# Patient Record
Sex: Male | Born: 1960
Health system: Southern US, Community
[De-identification: ages and names within clinical notes are randomized; demographics above are authoritative.]

---
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
## 2005-01-04 ENCOUNTER — Encounter: Admission: RE | Admit: 2005-01-04 | Discharge: 2005-01-04 | Payer: Self-pay | Admitting: Family Medicine

## 2008-06-13 ENCOUNTER — Encounter: Admission: RE | Admit: 2008-06-13 | Discharge: 2008-06-13 | Payer: Self-pay | Admitting: Family Medicine

## 2012-07-18 ENCOUNTER — Ambulatory Visit (INDEPENDENT_AMBULATORY_CARE_PROVIDER_SITE_OTHER): Payer: 59 | Admitting: Family Medicine

## 2012-07-18 ENCOUNTER — Ambulatory Visit (AMBULATORY_SURGERY_CENTER): Payer: 59 | Admitting: *Deleted

## 2012-07-18 ENCOUNTER — Encounter: Payer: Self-pay | Admitting: Family Medicine

## 2012-07-18 VITALS — Ht 74.0 in | Wt 182.0 lb

## 2012-07-18 VITALS — BP 139/87 | HR 64 | Ht 74.0 in | Wt 180.0 lb

## 2012-07-18 DIAGNOSIS — M722 Plantar fascial fibromatosis: Secondary | ICD-10-CM

## 2012-07-18 DIAGNOSIS — Z1211 Encounter for screening for malignant neoplasm of colon: Secondary | ICD-10-CM

## 2012-07-18 MED ORDER — NITROGLYCERIN 0.2 MG/HR TD PT24: MEDICATED_PATCH | TRANSDERMAL | Status: DC

## 2012-07-18 MED ORDER — MOVIPREP 100 G PO SOLR: ORAL | Status: DC

## 2012-07-18 NOTE — Assessment & Plan Note (Addendum)
Patient's clinical exam and ultrasound findings is very consistent with plantar fasciitis. Patient is given a nitroglycerin patch to wear. Protocol to go over with him as well as with over potential side effects. Patient will return on Monday with his running shoes as well as insoles that he has. We will monitor his running gait and see if there is any other mechanical problems that might be contributing to the plantar fasciitis. In addition to this patient may be a good candidate for custom orthotics. ADDENDUM: I agree with A&P per Dr Michaelle Copas note. I think he would be a good candidate for CSI given the fairly large amount of edema seen on [place] and teh chronicity of the issue. I would recommend we continue th NTG patch as well if he is tolerating it--6 weeks minimum. Gait analysis and possible orthotics.

## 2012-07-18 NOTE — Patient Instructions (Signed)
Try the nitro patch.  Try 1/4 patch daily at first.  Come back Monday with your running shoes, we may try to make you custom orthotics.

## 2012-07-18 NOTE — Progress Notes (Signed)
51 year old male coming in today as a new patient to [place]. Patient is coming in with right foot pain. Patient states that he has a past medical history significant for plantar fasciitis. Patient states she's had this for multiple years. Patient is a runner and is running about 20-25 miles per week. Patient states though this is gradually worsen especially over the course of the last couple months. Patient states that he used to get insoles for his shoes and he seemed to help significantly. Patient though states that even though he get a New insoles and shoes as well as insoles now did not make any improvement. Patient has tried anti-inflammatories and has been doing stretches but has not made any improvement. Patient is here for further evaluation. Patient denies any numbness any swelling. Patient states that the pain is worse when he does get up from a seated position or first up in the morning.  Review of system: 14 point system review was reviewed and negative. No past medical history on file. patient states unremarkable. No past surgical history on file. No family history on file. Allergies  Allergen Reactions  . Erythromycin Nausea And Vomiting   Physical exam Filed Vitals:   07/18/12 1021  BP: 139/87  Pulse: 64   General: No apparent distress healthy male Respiratory: Patient able to speak in full sentences and does not appear short of breath. Mood and affect normal Foot exam Normal inspection with no visable or palpable fat pad atrophy and no visible swelling/erythema. Patient is tender at medial insertion of plantar fascia into calcaneus. Great toe motion: Normal Arch shape: Patient has good longitudinal and transverse arches. Other foot breakdown: Patient was standing does have various deformity of the right hindfoot.  Ultrasound was done and interpreted by me today. Patient's plantar fasciitis was visualized showed that on insertion of the calcaneus patient does have hypoechoic  changes in the area. This extends to approximately 4 cm distally. No true tear appreciated. When measures though patient's plantar fasciitis at the insertion measures 0.83 cm which is significantly enlarged for this patient for his age group.

## 2012-07-20 ENCOUNTER — Encounter: Payer: Self-pay | Admitting: Family Medicine

## 2012-07-21 ENCOUNTER — Ambulatory Visit (INDEPENDENT_AMBULATORY_CARE_PROVIDER_SITE_OTHER): Payer: 59 | Admitting: Family Medicine

## 2012-07-21 VITALS — BP 148/78 | Ht 74.0 in | Wt 154.0 lb

## 2012-07-21 DIAGNOSIS — M722 Plantar fascial fibromatosis: Secondary | ICD-10-CM

## 2012-07-21 DIAGNOSIS — M79609 Pain in unspecified limb: Secondary | ICD-10-CM

## 2012-07-21 DIAGNOSIS — M79671 Pain in right foot: Secondary | ICD-10-CM

## 2012-07-22 ENCOUNTER — Encounter: Payer: Self-pay | Admitting: Family Medicine

## 2012-07-22 NOTE — Progress Notes (Signed)
  Subjective:    Patient ID: Timothy [name], male    DOB: 13-Apr-1961, 51 y.o.   MRN: [id number]  HPI  Right plan fasciitis. Started the nitroglycerin patch 2 days ago. Has had very slight headache but otherwise no side effects. He is here today for gait analysis and he brings his running shoes. Continues to have quite a bit of pain the plantar fascia of the right foot as described in the last office visit note.  Review of Systems No unusual weight change. Mild headache as described in history of present illness.    Objective:   Physical Exam  GENERAL: Well-developed male no acute distress vital signs are reviewed GAIT: Running. He'll [employer]. He strikes mostly on the medial aspect of the heel and in rolls a little further in word as he completes the plan phase. This is actually worse on the left foot. He has upright posture and normal swing phase. FEET: Mildly cavus. Tender to palpation the right plantar fascia origin. There is no skin lesions, no bruising. Neurovascularly intact.  INJECTION: Patient was given informed consent, signed copy in the chart. Appropriate time out was taken. Area prepped and draped in usual sterile fashion. One cc of methylprednisolone 40 mg/ml plus  one cc of 1% lidocaine without epinephrine was injected into the area of of the plantar fascia of the right foot using a(n) medial approach. The patient tolerated the procedure well. There were no complications. Post procedure instructions were given.    Patient was fitted for a : standard, cushioned, semi-rigid orthotic. The orthotic was heated, placed on the orthotic stand. The patient was positioned in subtalar neutral position and 10 degrees of ankle dorsiflexion in a weight bearing stance on the heated orthotic blank After completion of molding, a stable base was applied to the orthotic blank. The blank was ground to a stable position for weight bearing. Blank: Size 11 red Base: Blue foam Posting: Small amount  of light blue posting material at the medial heel distally.  Face to face time spent in evaluation, measurement and manufacture of custom molded orthotic was 40 minutes.     Assessment & Plan:

## 2012-07-24 ENCOUNTER — Encounter: Payer: Self-pay | Admitting: Internal Medicine

## 2012-07-24 ENCOUNTER — Ambulatory Visit (AMBULATORY_SURGERY_CENTER): Payer: 59 | Admitting: Internal Medicine

## 2012-07-24 VITALS — BP 143/87 | HR 70 | Temp 96.5°F | Resp 16 | Ht 74.0 in | Wt 182.0 lb

## 2012-07-24 DIAGNOSIS — D126 Benign neoplasm of colon, unspecified: Secondary | ICD-10-CM

## 2012-07-24 DIAGNOSIS — Z1211 Encounter for screening for malignant neoplasm of colon: Secondary | ICD-10-CM

## 2012-07-24 MED ORDER — SODIUM CHLORIDE 0.9 % IV SOLN: 500.0000 mL | INTRAVENOUS | Status: DC

## 2012-07-24 NOTE — Op Note (Signed)
[facility] [address] [place], [address]   COLONOSCOPY PROCEDURE REPORT  PATIENT: Timothy, [name]  MR#: [id number] BIRTHDATE: 1961/10/18 , 51  yrs. old GENDER: Male ENDOSCOPIST: Iva Boop, MD, [facility] REFERRED [id number] Shawnie Dapper, M.D. PROCEDURE DATE:  07/24/2012 PROCEDURE:   Colonoscopy with biopsy ASA CLASS:   Class I INDICATIONS:average risk screening. MEDICATIONS: propofol (Diprivan) 350mg  IV, MAC sedation, administered by CRNA, and These medications were titrated to patient response per physician's verbal order  DESCRIPTION OF PROCEDURE:   After the risks benefits and alternatives of the procedure were thoroughly explained, informed consent was obtained.  A digital rectal exam revealed the prostate was not enlarged.   The LB CF-[id number]  endoscope was introduced through the anus and advanced to the cecum, which was identified by both the appendix and ileocecal valve. No adverse events experienced.   The quality of the prep was excellent, using MoviPrep  The instrument was then slowly withdrawn as the colon was fully examined.      COLON FINDINGS: A sessile polyp was found in the distal sigmoid colon.  A polypectomy was performed with cold forceps.  The resection was complete and the polyp tissue was completely retrieved.   The colon mucosa was otherwise normal. Right colon retroflexion performed.  Retroflexed views revealed no abnormalities. The time to cecum=3 minutes 40 seconds  Withdrawal time=11:13 minutes.  The scope was withdrawn and the procedure completed. COMPLICATIONS: There were no complications. ENDOSCOPIC IMPRESSION: Sessile polyp2-3 mm size was found in the distal sigmoid colon; polypectomy was performed with cold forceps   RECOMMENDATIONS: Await pathology results  eSigned:  Iva Boop, MD, [facility] 07/24/2012 8:36 AM   cc: The Patient and Roderick Pee, MD

## 2012-07-24 NOTE — Progress Notes (Addendum)
Patient did not have preoperative order for IV antibiotic SSI prophylaxis. (G8918)  Patient did not experience any of the following events: a burn prior to discharge; a fall within the facility; wrong site/side/patient/procedure/implant event; or a hospital transfer or hospital admission upon discharge from the facility. (G8907)  

## 2012-07-24 NOTE — Patient Instructions (Addendum)
One very small polyp was removed - 2-3 mm. Looks benign.  I will let you know the results of the pathology report and recommendations for next routine colonoscopy.  Thank you for choosing me and [facility].  Iva Boop, MD, FACG YOU HAD AN ENDOSCOPIC PROCEDURE TODAY AT THE [facility]: Refer to the procedure report that was given to you for any specific questions about what was found during the examination.  If the procedure report does not answer your questions, please call your gastroenterologist to clarify.  If you requested that your care partner not be given the details of your procedure findings, then the procedure report has been included in a sealed envelope for you to review at your convenience later.  YOU SHOULD EXPECT: Some feelings of bloating in the abdomen. Passage of more gas than usual.  Walking can help get rid of the air that was put into your GI tract during the procedure and reduce the bloating. If you had a lower endoscopy (such as a colonoscopy or flexible sigmoidoscopy) you may notice spotting of blood in your stool or on the toilet paper. If you underwent a bowel prep for your procedure, then you may not have a normal bowel movement for a few days.  DIET: Your first meal following the procedure should be a light meal and then it is ok to progress to your normal diet.  A half-sandwich or bowl of soup is an example of a good first meal.  Heavy or fried foods are harder to digest and may make you feel nauseous or bloated.  Likewise meals heavy in dairy and vegetables can cause extra gas to form and this can also increase the bloating.  Drink plenty of fluids but you should avoid alcoholic beverages for 24 hours.  ACTIVITY: Your care partner should take you home directly after the procedure.  You should plan to take it easy, moving slowly for the rest of the day.  You can resume normal activity the day after the procedure however you should NOT DRIVE  or use heavy machinery for 24 hours (because of the sedation medicines used during the test).    SYMPTOMS TO REPORT IMMEDIATELY: A gastroenterologist can be reached at any hour.  During normal business hours, 8:30 AM to 5:00 PM Monday through Friday, call [phone].  After hours and on weekends, please call the GI answering service at [phone] who will take a message and have the physician on call contact you.   Following lower endoscopy (colonoscopy or flexible sigmoidoscopy):  Excessive amounts of blood in the stool  Significant tenderness or worsening of abdominal pains  Swelling of the abdomen that is new, acute  Fever of 100F or higher  FOLLOW UP: If any biopsies were taken you will be contacted by phone or by letter within the next 1-3 weeks.  Call your gastroenterologist if you have not heard about the biopsies in 3 weeks.  Our staff will call the home number listed on your records the next business day following your procedure to check on you and address any questions or concerns that you may have at that time regarding the information given to you following your procedure. This is a courtesy call and so if there is no answer at the home number and we have not heard from you through the emergency physician on call, we will assume that you have returned to your regular daily activities without incident.  SIGNATURES/CONFIDENTIALITY: You and/or your care  partner have signed paperwork which will be entered into your electronic medical record.  These signatures attest to the fact that that the information above on your After Visit Summary has been reviewed and is understood.  Full responsibility of the confidentiality of this discharge information lies with you and/or your care-partner.  

## 2012-07-25 ENCOUNTER — Telehealth: Payer: Self-pay | Admitting: *Deleted

## 2012-07-25 NOTE — Telephone Encounter (Signed)
  Follow up Call-  Call back number 07/24/2012  Post procedure Call Back phone  # [phone]  Permission to leave phone message Yes     Patient questions:  Do you have a fever, pain , or abdominal swelling? no Pain Score  0 *  Have you tolerated food without any problems? yes  Have you been able to return to your normal activities? yes  Do you have any questions about your discharge instructions: Diet   no Medications  no Follow up visit  no  Do you have questions or concerns about your Care? no  Actions: * If pain score is 4 or above: No action needed, pain <4.

## 2012-07-29 NOTE — Progress Notes (Signed)
Quick Note:  Hyperplastic polyp Repeat colon 10 years 2023 ______

## 2012-07-30 ENCOUNTER — Encounter: Payer: Self-pay | Admitting: Internal Medicine

## 2013-01-17 ENCOUNTER — Other Ambulatory Visit: Payer: Self-pay

## 2013-02-26 ENCOUNTER — Other Ambulatory Visit: Payer: Self-pay | Admitting: Internal Medicine

## 2013-02-26 LAB — COMPREHENSIVE METABOLIC PANEL
AST: 23 U/L (ref 0–37)
Albumin: 4.5 g/dL (ref 3.5–5.2)
Alkaline Phosphatase: 60 U/L (ref 39–117)
BUN: 12 mg/dL (ref 6–23)
Calcium: 9.4 mg/dL (ref 8.4–10.5)
Chloride: 100 mEq/L (ref 96–112)
Glucose, Bld: 82 mg/dL (ref 70–99)
Potassium: 4.4 mEq/L (ref 3.5–5.1)
Sodium: 136 mEq/L (ref 135–145)
Total Protein: 6.8 g/dL (ref 6.0–8.3)

## 2013-02-26 LAB — CBC WITH DIFFERENTIAL/PLATELET
Basophils Absolute: 0 10*3/uL (ref 0.0–0.1)
Eosinophils Absolute: 0.1 10*3/uL (ref 0.0–0.7)
Lymphocytes Relative: 39.4 % (ref 12.0–46.0)
MCHC: 33.9 g/dL (ref 30.0–36.0)
MCV: 88.5 fl (ref 78.0–100.0)
Monocytes Absolute: 0.5 10*3/uL (ref 0.1–1.0)
Neutrophils Relative %: 50.5 % (ref 43.0–77.0)
Platelets: 227 10*3/uL (ref 150.0–400.0)
RBC: 5.26 Mil/uL (ref 4.22–5.81)

## 2013-02-26 LAB — LIPID PANEL
Cholesterol: 147 mg/dL (ref 0–200)
LDL Cholesterol: 82 mg/dL (ref 0–99)
VLDL: 11.8 mg/dL (ref 0.0–40.0)

## 2013-02-26 LAB — PSA: PSA: 0.68 ng/mL (ref 0.10–4.00)

## 2013-03-06 ENCOUNTER — Ambulatory Visit (INDEPENDENT_AMBULATORY_CARE_PROVIDER_SITE_OTHER): Payer: 59 | Admitting: Internal Medicine

## 2013-03-06 DIAGNOSIS — D229 Melanocytic nevi, unspecified: Secondary | ICD-10-CM

## 2013-03-06 DIAGNOSIS — L819 Disorder of pigmentation, unspecified: Secondary | ICD-10-CM

## 2013-03-06 DIAGNOSIS — D239 Other benign neoplasm of skin, unspecified: Secondary | ICD-10-CM

## 2013-03-06 NOTE — Patient Instructions (Signed)
Leave the dressing in place until you've showered. Place a dab of Neosporin and a new bandage until a scab appears over the biopsy site. After the scab appears there is no need to keep a dressing in place unless the area can be irritated by clothing. Call our office if redness around the biopsy site appears and begins to spread more than a half inch from the site.  

## 2013-03-06 NOTE — Progress Notes (Signed)
  Subjective:    Patient ID: Timothy [name], male    DOB: 11/24/1961, 52 y.o.   MRN: [id number]  HPI  Change in mole on back with long sun exposure history   Review of Systems     Objective:   Physical Exam        Assessment & Plan:  Risk of skin ca moderate to high with non healing lesion on back Possible [place] CA Excisional Bx today Informed consent was given by the patient for a biopsy. The site was prepped with Betadine and using a 15 blade a 3cm elliptical biopsy was obtained. The specimin was placed in preservative and sent for pathology. Hemostasis was achieved with 7 5-0 sutures. Wound care was discussed with the patient. The patient was informed that it would be one to 2 weeks before the pathology will be interpreted.

## 2013-10-08 ENCOUNTER — Other Ambulatory Visit: Payer: Self-pay

## 2014-10-20 ENCOUNTER — Telehealth: Payer: Self-pay | Admitting: *Deleted

## 2014-10-20 DIAGNOSIS — K1379 Other lesions of oral mucosa: Secondary | ICD-10-CM

## 2014-10-20 NOTE — Telephone Encounter (Signed)
Pt needs a referral to Dr Lucia Gaskins for a small lesion on his soft palate x1 mth.  appt was scheduled for 11/24 at 1:30. Dr Burnice Logan is the referring provider.  Pt aware of appt.

## 2015-01-14 ENCOUNTER — Other Ambulatory Visit: Payer: Self-pay | Admitting: *Deleted

## 2015-01-24 MED ORDER — NEOMYCIN-POLYMYXIN-DEXAMETH 3.5-10000-0.1 OP SUSP: 1.0000 [drp] | Freq: Two times a day (BID) | OPHTHALMIC | Status: DC

## 2015-01-24 NOTE — Addendum Note (Signed)
Addended by: Townsend Roger D on: 01/24/2015 01:25 PM   Modules accepted: Orders

## 2016-02-03 DIAGNOSIS — Z0100 Encounter for examination of eyes and vision without abnormal findings: Secondary | ICD-10-CM | POA: Diagnosis not present

## 2017-02-08 DIAGNOSIS — H5213 Myopia, bilateral: Secondary | ICD-10-CM | POA: Diagnosis not present

## 2017-03-01 DIAGNOSIS — Z85828 Personal history of other malignant neoplasm of skin: Secondary | ICD-10-CM | POA: Diagnosis not present

## 2017-03-01 DIAGNOSIS — L739 Follicular disorder, unspecified: Secondary | ICD-10-CM | POA: Diagnosis not present

## 2017-03-01 DIAGNOSIS — L989 Disorder of the skin and subcutaneous tissue, unspecified: Secondary | ICD-10-CM | POA: Diagnosis not present

## 2017-03-01 DIAGNOSIS — L738 Other specified follicular disorders: Secondary | ICD-10-CM | POA: Diagnosis not present

## 2017-03-07 DIAGNOSIS — L738 Other specified follicular disorders: Secondary | ICD-10-CM | POA: Insufficient documentation

## 2017-06-06 ENCOUNTER — Telehealth: Payer: Self-pay | Admitting: *Deleted

## 2017-06-06 ENCOUNTER — Encounter: Payer: Self-pay | Admitting: *Deleted

## 2017-06-06 NOTE — Telephone Encounter (Signed)
-  Patient EKG in 2008 was "normal variant" and "IRBBB" -Timothy [name] did colonoscopy    Related to appointment tomorrow (06/07/17): -Expect routine blood work  -Physical tomorrow

## 2017-06-07 ENCOUNTER — Ambulatory Visit (INDEPENDENT_AMBULATORY_CARE_PROVIDER_SITE_OTHER): Payer: 59 | Admitting: Family Medicine

## 2017-06-07 ENCOUNTER — Encounter: Payer: Self-pay | Admitting: Family Medicine

## 2017-06-07 VITALS — BP 116/78 | HR 63 | Temp 97.9°F | Ht 74.0 in | Wt 180.6 lb

## 2017-06-07 DIAGNOSIS — Z0000 Encounter for general adult medical examination without abnormal findings: Secondary | ICD-10-CM

## 2017-06-07 LAB — COMPREHENSIVE METABOLIC PANEL
ALT: 13 U/L (ref 0–53)
AST: 16 U/L (ref 0–37)
Albumin: 4.3 g/dL (ref 3.5–5.2)
Alkaline Phosphatase: 49 U/L (ref 39–117)
BUN: 13 mg/dL (ref 6–23)
CO2: 28 mEq/L (ref 19–32)
Calcium: 9.4 mg/dL (ref 8.4–10.5)
Chloride: 106 mEq/L (ref 96–112)
Creatinine, Ser: 1.11 mg/dL (ref 0.40–1.50)
GFR: 72.83 mL/min (ref >60.00)
Glucose, Bld: 100 mg/dL — ABNORMAL HIGH (ref 70–99)
Potassium: 4.2 mEq/L (ref 3.5–5.1)
Sodium: 140 mEq/L (ref 135–145)
Total Bilirubin: 1 mg/dL (ref 0.2–1.2)
Total Protein: 6.6 g/dL (ref 6.0–8.3)

## 2017-06-07 LAB — CBC WITH DIFFERENTIAL/PLATELET
Basophils Absolute: 0 10*3/uL (ref 0.0–0.1)
Basophils Relative: 0.7 % (ref 0.0–3.0)
Eosinophils Absolute: 0 10*3/uL (ref 0.0–0.7)
Eosinophils Relative: 0.9 % (ref 0.0–5.0)
HCT: 43.4 % (ref 39.0–52.0)
Hemoglobin: 14.6 g/dL (ref 13.0–17.0)
Lymphocytes Relative: 39.8 % (ref 12.0–46.0)
Lymphs Abs: 1.7 10*3/uL (ref 0.7–4.0)
MCHC: 33.7 g/dL (ref 30.0–36.0)
MCV: 90.8 fl (ref 78.0–100.0)
Monocytes Absolute: 0.3 10*3/uL (ref 0.1–1.0)
Monocytes Relative: 7 % (ref 3.0–12.0)
Neutro Abs: 2.2 10*3/uL (ref 1.4–7.7)
Neutrophils Relative %: 51.6 % (ref 43.0–77.0)
Platelets: 208 10*3/uL (ref 150.0–400.0)
RBC: 4.78 Mil/uL (ref 4.22–5.81)
RDW: 13.5 % (ref 11.5–15.5)
WBC: 4.2 10*3/uL (ref 4.0–10.5)

## 2017-06-07 LAB — LIPID PANEL
Cholesterol: 135 mg/dL (ref 0–200)
HDL: 65.6 mg/dL (ref >39.00)
LDL Cholesterol: 57 mg/dL (ref 0–99)
NonHDL: 69.06
Total CHOL/HDL Ratio: 2
Triglycerides: 58 mg/dL (ref 0.0–149.0)
VLDL: 11.6 mg/dL (ref 0.0–40.0)

## 2017-06-07 NOTE — Progress Notes (Signed)
Subjective:  Shaune Pascal CMA acting as scribe for Dr. Juleen China.   Timothy [name] is a 56 y.o. male and is here for a comprehensive physical exam.  HPI: No current concerns. Exercises 6 days per week. Eats healthfully. Yearly labs WNL. Colonoscopy up to date.   Health Maintenance Due  Topic Date Due  . Hepatitis C Screening  Mar 13, 1961  . HIV Screening  06/05/1976  . TETANUS/TDAP  06/05/1980   PMHx, SurgHx, SocialHx, Medications, and Allergies were reviewed in the Visit Navigator and updated as appropriate.   No past medical history on file.  Past Surgical History:  Procedure Laterality Date  . TONSILLECTOMY  1966    Family History  Problem Relation Age of Onset  . Cancer Mother        Non-Hodgkin Lymphoma  . Heart disease Father        A.Fib   . Cancer Brother        Glioblastoma (gbm)  . Cancer Maternal Grandmother        oral squamous cell  . Heart disease Maternal Grandfather        myocardial infarction  . Cancer Paternal Grandmother        Breast Cancer  . Colon cancer Neg Hx   . Colon polyps Neg Hx   . Prostate cancer Neg Hx   . Rectal cancer Neg Hx    Social History  Substance Use Topics  . Smoking status: Never Smoker  . Smokeless tobacco: Never Used  . Alcohol use 1.8 oz/week    3 Glasses of wine per week   Review of Systems:   Review of Systems  Constitutional: Negative for chills, fever, malaise/fatigue and weight loss.  Respiratory: Negative for cough, shortness of breath and wheezing.   Cardiovascular: Negative for chest pain, palpitations and leg swelling.  Gastrointestinal: Negative for abdominal pain, constipation, diarrhea, nausea and vomiting.  Genitourinary: Negative for dysuria, frequency, hematuria and urgency.  Musculoskeletal: Negative for joint pain and myalgias.  Skin: Negative for rash.  Neurological: Negative for dizziness and headaches.  Psychiatric/Behavioral: Negative for depression, substance abuse and suicidal ideas. The  patient is not nervous/anxious.    Objective:    Vitals:   06/07/17 0748  BP: 116/78  Pulse: 63  Temp: 97.9 F (36.6 C)   Body mass index is 23.19 kg/m.  General  Alert, cooperative, no distress, appears stated age  Head:  Normocephalic, without obvious abnormality, atraumatic  Eyes:  PERRL, conjunctiva/corneas clear, EOM's intact, fundi benign, both eyes       Ears:  Normal TM's and external ear canals, both ears  Nose: Nares normal, septum midline, mucosa normal, no drainage or sinus tenderness  Throat: Lips, mucosa, and tongue normal; teeth and gums normal  Neck: Supple, symmetrical, trachea midline, no adenopathy;     thyroid:  No enlargement/tenderness/nodules; no carotid bruit or JVD  Back:   Symmetric, no curvature, ROM normal, no CVA tenderness  Lungs:   Clear to auscultation bilaterally, respirations unlabored  Chest wall:  No tenderness or deformity  Heart:  Regular rate and rhythm, S1 and S2 normal, no murmur, rub or gallop  Abdomen:   Soft, non-tender, bowel sounds active all four quadrants, no masses, no organomegaly  Extremities: Extremities normal, atraumatic, no cyanosis or edema  Prostate : Not done.  Skin: Skin color, texture, turgor normal, no rashes or lesions  Lymph nodes: Cervical, supraclavicular, and axillary nodes normal  Neurologic: CNII-XII grossly intact. Normal strength, sensation and reflexes throughout  Results for orders placed or performed in visit on 06/07/17  CBC with Differential/Platelet  Result Value Ref Range   WBC 4.2 4.0 - 10.5 K/uL   RBC 4.78 4.22 - 5.81 Mil/uL   Hemoglobin 14.6 13.0 - 17.0 g/dL   HCT 43.4 39.0 - 52.0 %   MCV 90.8 78.0 - 100.0 fl   MCHC 33.7 30.0 - 36.0 g/dL   RDW 13.5 11.5 - 15.5 %   Platelets 208.0 150.0 - 400.0 K/uL   Neutrophils Relative % 51.6 43.0 - 77.0 %   Lymphocytes Relative 39.8 12.0 - 46.0 %   Monocytes Relative 7.0 3.0 - 12.0 %   Eosinophils Relative 0.9 0.0 - 5.0 %   Basophils Relative 0.7 0.0  - 3.0 %   Neutro Abs 2.2 1.4 - 7.7 K/uL   Lymphs Abs 1.7 0.7 - 4.0 K/uL   Monocytes Absolute 0.3 0.1 - 1.0 K/uL   Eosinophils Absolute 0.0 0.0 - 0.7 K/uL   Basophils Absolute 0.0 0.0 - 0.1 K/uL  Comprehensive metabolic panel  Result Value Ref Range   Sodium 140 135 - 145 mEq/L   Potassium 4.2 3.5 - 5.1 mEq/L   Chloride 106 96 - 112 mEq/L   CO2 28 19 - 32 mEq/L   Glucose, Bld 100 (H) 70 - 99 mg/dL   BUN 13 6 - 23 mg/dL   Creatinine, Ser 1.11 0.40 - 1.50 mg/dL   Total Bilirubin 1.0 0.2 - 1.2 mg/dL   Alkaline Phosphatase 49 39 - 117 U/L   AST 16 0 - 37 U/L   ALT 13 0 - 53 U/L   Total Protein 6.6 6.0 - 8.3 g/dL   Albumin 4.3 3.5 - 5.2 g/dL   Calcium 9.4 8.4 - 10.5 mg/dL   GFR 72.83 >60.00 mL/min  Lipid panel  Result Value Ref Range   Cholesterol 135 0 - 200 mg/dL   Triglycerides 58.0 0.0 - 149.0 mg/dL   HDL 65.60 >39.00 mg/dL   VLDL 11.6 0.0 - 40.0 mg/dL   LDL Cholesterol 57 0 - 99 mg/dL   Total CHOL/HDL Ratio 2    NonHDL 69.06    EKG: sinus bradycardia.  AssessmentPlan:   Armany was seen today for annual exam.  Diagnoses and all orders for this visit:  Routine physical examination -     CBC with Differential/Platelet -     Comprehensive metabolic panel -     Lipid panel -     EKG 12-Lead    Patient Counseling: [x]   Nutrition: Stressed importance of moderation in sodium/caffeine intake, saturated fat and cholesterol, caloric balance, sufficient intake of fresh fruits, vegetables, and fiber  [x]   Stressed the importance of regular exercise.   []   Substance Abuse: Discussed cessation/primary prevention of tobacco, alcohol, or other drug use; driving or other dangerous activities under the influence; availability of treatment for abuse.   [x]   Injury prevention: Discussed safety belts, safety helmets, smoke detector, smoking near bedding or upholstery.   []   Sexuality: Discussed sexually transmitted diseases, partner selection, use of condoms, avoidance of unintended  pregnancy  and contraceptive alternatives.   [x]   Dental health: Discussed importance of regular tooth brushing, flossing, and dental visits.  [x]   Health maintenance and immunizations reviewed. Please refer to Health maintenance section.    Briscoe Deutscher, DO [facility] [place] served as Education administrator during this visit. History, Physical, and Plan performed by medical provider. The above documentation has been reviewed and is accurate and complete.  Briscoe Deutscher, D.O.

## 2017-08-23 ENCOUNTER — Encounter: Payer: Self-pay | Admitting: Family Medicine

## 2018-02-19 DIAGNOSIS — H5213 Myopia, bilateral: Secondary | ICD-10-CM | POA: Diagnosis not present

## 2018-02-19 DIAGNOSIS — H52203 Unspecified astigmatism, bilateral: Secondary | ICD-10-CM | POA: Diagnosis not present

## 2019-06-16 ENCOUNTER — Other Ambulatory Visit: Payer: Self-pay

## 2019-06-16 ENCOUNTER — Ambulatory Visit (INDEPENDENT_AMBULATORY_CARE_PROVIDER_SITE_OTHER): Payer: 59 | Admitting: Family Medicine

## 2019-06-16 ENCOUNTER — Encounter: Payer: Self-pay | Admitting: Family Medicine

## 2019-06-16 ENCOUNTER — Ambulatory Visit: Payer: Self-pay

## 2019-06-16 VITALS — BP 130/84 | HR 50 | Ht 74.0 in | Wt 189.0 lb

## 2019-06-16 DIAGNOSIS — M79671 Pain in right foot: Secondary | ICD-10-CM | POA: Diagnosis not present

## 2019-06-16 DIAGNOSIS — G8929 Other chronic pain: Secondary | ICD-10-CM

## 2019-06-16 MED ORDER — VITAMIN D (ERGOCALCIFEROL) 1.25 MG (50000 UNIT) PO CAPS: 50000.0000 [IU] | ORAL_CAPSULE | ORAL | 0 refills | Status: AC

## 2019-06-16 MED ORDER — NITROGLYCERIN 0.1 MG/HR TD PT24: MEDICATED_PATCH | TRANSDERMAL | 12 refills | Status: AC

## 2019-06-16 MED FILL — NITROGLYCERIN 0.1 MG/HR PTC: 0.1 | 90 days supply | Qty: 30 | Fill #0

## 2019-06-16 MED FILL — VIT D2 1.25 MG (50,000 UNIT: 1.25 MG | 84 days supply | Qty: 12 | Fill #0

## 2019-06-16 NOTE — Patient Instructions (Signed)
Good to see you.  Exercises 3 times a week.  Once weekly vitamin D for 12 weeks.   Nitroglycerin Protocol   Apply 1/4 nitroglycerin patch to affected area daily.  Change position of patch within the affected area every 24 hours.  You may experience a headache during the first 1-2 weeks of using the patch, these should subside.  If you experience headaches after beginning nitroglycerin patch treatment, you may take your preferred over the counter pain reliever.  Another side effect of the nitroglycerin patch is skin irritation or rash related to patch adhesive.  Please notify our office if you develop more severe headaches or rash, and stop the patch.  Tendon healing with nitroglycerin patch may require 12 to 24 weeks depending on the extent of injury.  Men should not use if taking Viagra, Cialis, or Levitra.   Do not use if you have migraines or rosacea.  See me again in 3-4 weeks

## 2019-06-16 NOTE — Progress Notes (Signed)
[facility] [address], [place] [address] Phone: [phone] Subjective:   I Kandace Blitz am serving as a Education administrator for Dr. Hulan Saas.  I'm seeing this patient by the request  of:    CC: Right foot pain  [id number]  Timothy [name] is a 58 y.o. male coming in with complaint of right foot pain.  Patient is avid runner.  States that running more than 3 miles causes increasing discomfort and pain  Onset- 6 months  Location- Heel Character- sharp  Aggravating factors- mornings, running  Reliving factors- ice, heat, oral, topical   Severity-5 out of 10 but seems to be worsening with increasing activity     No past medical history on file. Past Surgical History:  Procedure Laterality Date  . TONSILLECTOMY  1966   Social History   Socioeconomic History  . Marital status: Married    Spouse name: Not on file  . Number of children: Not on file  . Years of education: Not on file  . Highest education level: Not on file  Occupational History  . Not on file  Social Needs  . Financial resource strain: Not on file  . Food insecurity    Worry: Not on file    Inability: Not on file  . Transportation needs    Medical: Not on file    Non-medical: Not on file  Tobacco Use  . Smoking status: Never Smoker  . Smokeless tobacco: Never Used  Substance and Sexual Activity  . Alcohol use: Yes    Alcohol/week: 3.0 standard drinks    Types: 3 Glasses of wine per week  . Drug use: No  . Sexual activity: Never    Comment: Married  Lifestyle  . Physical activity    Days per week: Not on file    Minutes per session: Not on file  . Stress: Not on file  Relationships  . Social Herbalist on phone: Not on file    Gets together: Not on file    Attends religious service: Not on file    Active member of club or organization: Not on file    Attends meetings of clubs or organizations: Not on file    Relationship status: Not on file   Other Topics Concern  . Not on file  Social History Narrative  . Not on file   Allergies  Allergen Reactions  . Erythromycin Nausea And Vomiting   Family History  Problem Relation Age of Onset  . Cancer Mother        Non-Hodgkin Lymphoma  . Heart disease Father        A.Fib   . Cancer Brother        Glioblastoma (gbm)  . Cancer Maternal Grandmother        oral squamous cell  . Heart disease Maternal Grandfather        myocardial infarction  . Cancer Paternal Grandmother        Breast Cancer  . Colon cancer Neg Hx   . Colon polyps Neg Hx   . Prostate cancer Neg Hx   . Rectal cancer Neg Hx      Current Outpatient Medications (Cardiovascular):  .  nitroGLYCERIN (NITRO-DUR) 0.1 mg/hr patch, 1/4 patch daily     Current Outpatient Medications (Other):  Marland Kitchen  Vitamin D, Ergocalciferol, (DRISDOL) 1.25 MG (50000 UT) CAPS capsule, Take 1 capsule (50,000 Units total) by mouth every 7 (seven) days.  Past medical history, social, surgical and family history all reviewed in electronic medical record.  No pertanent information unless stated regarding to the chief complaint.   Review of Systems:  No headache, visual changes, nausea, vomiting, diarrhea, constipation, dizziness, abdominal pain, skin rash, fevers, chills, night sweats, weight loss, swollen lymph nodes, body aches, joint swelling,  chest pain, shortness of breath, mood changes.  Positive muscle aches  Objective  Blood pressure 130/84, pulse (!) 50, height 6\' 2"  (1.88 m), weight 189 lb (85.7 kg), SpO2 98 %.    General: No apparent distress alert and oriented x3 mood and affect normal, dressed appropriately.  HEENT: Pupils equal, extraocular movements intact  Respiratory: Patient's speak in full sentences and does not appear short of breath  Cardiovascular: No lower extremity edema, non tender, no erythema  Skin: Warm dry intact with no signs of infection or rash on extremities or on axial skeleton.  Abdomen: Soft  nontender  Neuro: Cranial nerves II through XII are intact, neurovascularly intact in all extremities with 2+ DTRs and 2+ pulses.  Lymph: No lymphadenopathy of posterior or anterior cervical chain or axillae bilaterally.  Gait normal with good balance and coordination.  MSK:  Non tender with full range of motion and good stability and symmetric strength and tone of shoulders, elbows, wrist, hip, knee bilaterally.  Patient's right ankle seems to be fairly unremarkable on spectrum.  Patient does have tenderness more on the plantar aspect of the foot.  None over the lateral and mid medial malleolus.  Near full range of motion with some mild weakness with eversion.  Patient has a negative Tinel sign.  Neurovascularly intact distally.  No significant breakdown of the longitudinal arch with very mild breakdown of the transverse arch.  Limited musculoskeletal ultrasound was performed to interpreted by Lyndal Pulley  Limited ultrasound of patient's ankle and heel shows the patient does have some mild superficial hypoechoic changes Patient does have some calcific concern for potential stress reaction.  Mild soft callus formation but no increase in neovascularization or Doppler flow.  Plantar nerve seems to be unremarkable.    Impression and Recommendations:     This case required medical decision making of moderate complexity. The above documentation has been reviewed and is accurate and complete Lyndal Pulley, DO       Note: This dictation was prepared with Dragon dictation along with smaller phrase technology. Any transcriptional errors that result from this process are unintentional.

## 2019-06-16 NOTE — Assessment & Plan Note (Signed)
Patient is a stress reaction.  Patient partial.  Warned of potential side effects of both of these.  I do believe that patient has some mild fasciitis that seems to be recurrent but also has a stress reaction of the heel.  Patient given exercises to stretch as well as strengthen the posterior cords medical be beneficial.  Discussed over-the-counter orthotics as well as discussed the recovery sandals anything could be beneficial.  Hold off running for the next few weeks if possible.  Follow-up again in 4 to 6 weeks

## 2019-07-08 ENCOUNTER — Other Ambulatory Visit: Payer: Self-pay

## 2019-07-08 ENCOUNTER — Ambulatory Visit: Payer: Self-pay

## 2019-07-08 ENCOUNTER — Encounter: Payer: Self-pay | Admitting: Family Medicine

## 2019-07-08 ENCOUNTER — Ambulatory Visit (INDEPENDENT_AMBULATORY_CARE_PROVIDER_SITE_OTHER): Payer: 59 | Admitting: Family Medicine

## 2019-07-08 VITALS — BP 130/78 | HR 54 | Ht 74.0 in | Wt 190.0 lb

## 2019-07-08 DIAGNOSIS — G8929 Other chronic pain: Secondary | ICD-10-CM | POA: Diagnosis not present

## 2019-07-08 DIAGNOSIS — M79671 Pain in right foot: Secondary | ICD-10-CM

## 2019-07-08 NOTE — Assessment & Plan Note (Signed)
Patient still has what appears to be a cortical defect noted of the calcaneal region that seems to expand laterally.  Patient has not made significant strides at this time but may be some mild decrease in hypoechoic changes.  Encourage patient continue the nitroglycerin, once weekly vitamin D, and given a bone stimulator that I think will be beneficial.  Discussed K2 over-the-counter that I think will be also helpful.  We discussed proper shoe wear that I think will be making some improvement.  Discussed running shoes.  Follow-up again in 4 to 6 weeks to make sure patient is healing appropriately

## 2019-07-08 NOTE — Progress Notes (Signed)
[facility] [place], [place] [address] Phone: [phone] Subjective:   I Timothy [name] am serving as a Education administrator for Dr. Hulan Saas.   CC: Right heel pain follow-up  [id number]   06/16/2019 Patient is a stress reaction.  Patient partial.  Warned of potential side effects of both of these.  I do believe that patient has some mild fasciitis that seems to be recurrent but also has a stress reaction of the heel.  Patient given exercises to stretch as well as strengthen the posterior cords medical be beneficial.  Discussed over-the-counter orthotics as well as discussed the recovery sandals anything could be beneficial.  Hold off running for the next few weeks if possible.  Follow-up again in 4 to 6 weeks  07/08/2019 Timothy [name] is a 58 y.o. male coming in with complaint of right heel pain. States the heel is doing somewhat better. Feels about the same.  Patient started the vitamin D as well as the nitroglycerin.  Patient still states that the runs for too long has some increasing discomfort and pain.  Patient states that most regular daily activity seems to be doing relatively well though.  Still notice that if he tried to run he would have worsening pain    No past medical history on file. Past Surgical History:  Procedure Laterality Date  . TONSILLECTOMY  1966   Social History   Socioeconomic History  . Marital status: Married    Spouse name: Not on file  . Number of children: Not on file  . Years of education: Not on file  . Highest education level: Not on file  Occupational History  . Not on file  Social Needs  . Financial resource strain: Not on file  . Food insecurity    Worry: Not on file    Inability: Not on file  . Transportation needs    Medical: Not on file    Non-medical: Not on file  Tobacco Use  . Smoking status: Never Smoker  . Smokeless tobacco: Never Used  Substance and Sexual Activity  . Alcohol use: Yes   Alcohol/week: 3.0 standard drinks    Types: 3 Glasses of wine per week  . Drug use: No  . Sexual activity: Never    Comment: Married  Lifestyle  . Physical activity    Days per week: Not on file    Minutes per session: Not on file  . Stress: Not on file  Relationships  . Social Herbalist on phone: Not on file    Gets together: Not on file    Attends religious service: Not on file    Active member of club or organization: Not on file    Attends meetings of clubs or organizations: Not on file    Relationship status: Not on file  Other Topics Concern  . Not on file  Social History Narrative  . Not on file   Allergies  Allergen Reactions  . Erythromycin Nausea And Vomiting   Family History  Problem Relation Age of Onset  . Cancer Mother        Non-Hodgkin Lymphoma  . Heart disease Father        A.Fib   . Cancer Brother        Glioblastoma (gbm)  . Cancer Maternal Grandmother        oral squamous cell  . Heart disease Maternal Grandfather        myocardial  infarction  . Cancer Paternal Grandmother        Breast Cancer  . Colon cancer Neg Hx   . Colon polyps Neg Hx   . Prostate cancer Neg Hx   . Rectal cancer Neg Hx      Current Outpatient Medications (Cardiovascular):  .  nitroGLYCERIN (NITRO-DUR) 0.1 mg/hr patch, 1/4 patch daily     Current Outpatient Medications (Other):  Marland Kitchen  Vitamin D, Ergocalciferol, (DRISDOL) 1.25 MG (50000 UT) CAPS capsule, Take 1 capsule (50,000 Units total) by mouth every 7 (seven) days.    Past medical history, social, surgical and family history all reviewed in electronic medical record.  No pertanent information unless stated regarding to the chief complaint.   Review of Systems:  No headache, visual changes, nausea, vomiting, diarrhea, constipation, dizziness, abdominal pain, skin rash, fevers, chills, night sweats, weight loss, swollen lymph nodes, body aches, joint swelling, muscle aches, chest pain, shortness of  breath, mood changes.   Objective  Blood pressure 130/78, pulse (!) 54, height 6\' 2"  (1.88 m), weight 190 lb (86.2 kg), SpO2 98 %.    General: No apparent distress alert and oriented x3 mood and affect normal, dressed appropriately.  HEENT: Pupils equal, extraocular movements intact  Respiratory: Patient's speak in full sentences and does not appear short of breath  Cardiovascular: No lower extremity edema, non tender, no erythema  Skin: Warm dry intact with no signs of infection or rash on extremities or on axial skeleton.  Abdomen: Soft nontender  Neuro: Cranial nerves II through XII are intact, neurovascularly intact in all extremities with 2+ DTRs and 2+ pulses.  Lymph: No lymphadenopathy of posterior or anterior cervical chain or axillae bilaterally.  Gait normal with good balance and coordination.  MSK:  Non tender with full range of motion and good stability and symmetric strength and tone of shoulders, elbows, wrist, hip, knee bilaterally.    Right ankle shows the patient does have still some mild tenderness over the calcaneal area as well as over the lateral aspect of the calcaneal region.  No significant swelling noted.  Patient has full range of motion of the ankle with no significant tenderness.  5 out of 5 strength of the ankle noted.  Limited musculoskeletal ultrasound was performed and interpreted by Timothy [name]  Limited musculoskeletal ultrasound shows the patient still has a cortical defect noted over the calcaneal region that does seem to expand laterally.  Patient has some mild callus formation that seems to be more soft tissue than previous. Impression: Mild interval healing but likely delayed.     Impression and Recommendations:     This case required medical decision making of moderate complexity. The above documentation has been reviewed and is accurate and complete Timothy Pulley, DO       Note: This dictation was prepared with Dragon dictation along  with smaller phrase technology. Any transcriptional errors that result from this process are unintentional.

## 2019-07-08 NOTE — Patient Instructions (Signed)
Good to see you Continue vitamin D Continue nitro [place] carbon x K2 200mg  daily See me again in 4 weeks

## 2019-07-31 ENCOUNTER — Encounter: Payer: Self-pay | Admitting: Family Medicine

## 2019-08-04 ENCOUNTER — Other Ambulatory Visit: Payer: Self-pay

## 2019-08-04 DIAGNOSIS — M722 Plantar fascial fibromatosis: Secondary | ICD-10-CM

## 2019-08-04 DIAGNOSIS — M79671 Pain in right foot: Secondary | ICD-10-CM

## 2019-08-04 DIAGNOSIS — G8929 Other chronic pain: Secondary | ICD-10-CM

## 2019-08-12 ENCOUNTER — Ambulatory Visit: Payer: 59 | Admitting: Family Medicine

## 2019-08-12 ENCOUNTER — Ambulatory Visit
Admission: RE | Admit: 2019-08-12 | Discharge: 2019-08-12 | Disposition: A | Discharge status: Home / Self Care | Payer: 59 | Source: Ambulatory Visit | Attending: Family Medicine | Admitting: Family Medicine

## 2019-08-12 ENCOUNTER — Other Ambulatory Visit: Payer: Self-pay

## 2019-08-12 DIAGNOSIS — R600 Localized edema: Secondary | ICD-10-CM | POA: Diagnosis not present

## 2019-08-12 DIAGNOSIS — M79671 Pain in right foot: Secondary | ICD-10-CM

## 2019-08-12 DIAGNOSIS — M722 Plantar fascial fibromatosis: Secondary | ICD-10-CM

## 2019-08-13 NOTE — Progress Notes (Signed)
[facility] [name], [place] [address] Phone: [phone] Subjective:   Timothy [name], am serving as a scribe for Dr. Hulan Saas.  I'm seeing this patient by the request  of:    CC: Foot pain follow-up  [id number]   07/08/2019 Patient still has what appears to be a cortical defect noted of the calcaneal region that seems to expand laterally.  Patient has not made significant strides at this time but may be some mild decrease in hypoechoic changes.  Encourage patient continue the nitroglycerin, once weekly vitamin D, and given a bone stimulator that I think will be beneficial.  Discussed K2 over-the-counter that I think will be also helpful.  We discussed proper shoe wear that I think will be making some improvement.  Discussed running shoes.  Follow-up again in 4 to 6 weeks to make sure patient is healing appropriately  Update 08/14/2019 Timothy [name] is a 58 y.o. male coming in with complaint of right heel pain. Patient states that his pain is continuing. He is here to discuss his MRI results.  MRI results showed the patient did have moderate to severe plantar fasciitis with a punctate tear noted on the medial aspect of the plantar fascia.  Patient did have some edema of the calcaneal region    [name] past medical history on file. Past Surgical History:  Procedure Laterality Date  . TONSILLECTOMY  1966   Social History   Socioeconomic History  . Marital status: Married    Spouse name: Not on file  . Number of children: Not on file  . Years of education: Not on file  . Highest education level: Not on file  Occupational History  . Not on file  Social Needs  . Financial resource strain: Not on file  . Food insecurity    Worry: Not on file    Inability: Not on file  . Transportation needs    Medical: Not on file    Non-medical: Not on file  Tobacco Use  . Smoking status: Never Smoker  . Smokeless tobacco: Never Used  Substance  and Sexual Activity  . Alcohol use: Yes    Alcohol/week: 3.0 standard drinks    Types: 3 Glasses of wine per week  . Drug use: [name]  . Sexual activity: Never    Comment: Married  Lifestyle  . Physical activity    Days per week: Not on file    Minutes per session: Not on file  . Stress: Not on file  Relationships  . Social Herbalist on phone: Not on file    Gets together: Not on file    Attends religious service: Not on file    Active member of club or organization: Not on file    Attends meetings of clubs or organizations: Not on file    Relationship status: Not on file  Other Topics Concern  . Not on file  Social History Narrative  . Not on file   Allergies  Allergen Reactions  . Erythromycin Nausea And Vomiting   Family History  Problem Relation Age of Onset  . Cancer Mother        Non-Hodgkin Lymphoma  . Heart disease Father        A.Fib   . Cancer Brother        Glioblastoma (gbm)  . Cancer Maternal Grandmother        oral squamous cell  . Heart disease Maternal  Grandfather        myocardial infarction  . Cancer Paternal Grandmother        Breast Cancer  . Colon cancer Neg Hx   . Colon polyps Neg Hx   . Prostate cancer Neg Hx   . Rectal cancer Neg Hx      Current Outpatient Medications (Cardiovascular):  .  nitroGLYCERIN (NITRO-DUR) 0.1 mg/hr patch, 1/4 patch daily     Current Outpatient Medications (Other):  Marland Kitchen  Vitamin D, Ergocalciferol, (DRISDOL) 1.25 MG (50000 UT) CAPS capsule, Take 1 capsule (50,000 Units total) by mouth every 7 (seven) days.    Past medical history, social, surgical and family history all reviewed in electronic medical record.  [name] pertanent information unless stated regarding to the chief complaint.   Review of Systems:  [name] headache, visual changes, nausea, vomiting, diarrhea, constipation, dizziness, abdominal pain, skin rash, fevers, chills, night sweats, weight loss, swollen lymph nodes, body aches, joint swelling,  muscle aches, chest pain, shortness of breath, mood changes.   Objective  Blood pressure 128/72, pulse 60, height 6\' 2"  (1.88 m), weight 191 lb (86.6 kg), SpO2 97 %.    General: [name] apparent distress alert and oriented x3 mood and affect normal, dressed appropriately.  HEENT: Pupils equal, extraocular movements intact  Respiratory: Patient's speak in full sentences and does not appear short of breath  Cardiovascular: [name] lower extremity edema, non tender, [name] erythema  Skin: Warm dry intact with [name] signs of infection or rash on extremities or on axial skeleton.  Abdomen: Soft nontender  Neuro: Cranial nerves II through XII are intact, neurovascularly intact in all extremities with 2+ DTRs and 2+ pulses.  Lymph: [name] lymphadenopathy of posterior or anterior cervical chain or axillae bilaterally.  Gait normal with good balance and coordination.  MSK:  Non tender with full range of motion and good stability and symmetric strength and tone of shoulders, elbows, wrist, hip, knee and ankles bilaterally.  Right foot exam still shows the patient does have tender to palpation over the medial calcaneal region patient has [name] significant swelling though noted.  Procedure: Real-time Ultrasound Guided Injection of right plantar fascia Device: GE Logiq Q7 Ultrasound guided injection is preferred based studies that show increased duration, increased effect, greater accuracy, decreased procedural pain, increased response rate, and decreased cost with ultrasound guided versus blind injection.  Verbal informed consent obtained.  Time-out conducted.  Noted [name] overlying erythema, induration, or other signs of local infection.  Skin prepped in a sterile fashion.  Local anesthesia: Topical Ethyl chloride.  With sterile technique and under real time ultrasound guidance: With a 25-gauge 1 inch needle patient was injected with a total of 0.5 cc of 0.5% Marcaine then injected 4 cc of pre-centrifuge PRP. Completed without  difficulty  Pain immediately resolved suggesting accurate placement of the medication.  Advised to call if fevers/chills, erythema, induration, drainage, or persistent bleeding.  Images permanently stored and available for review in the ultrasound unit.  Impression: Technically successful ultrasound guided injection.   Impression and Recommendations:     This case required medical decision making of moderate complexity. The above documentation has been reviewed and is accurate and complete Timothy Pulley, DO       Note: This dictation was prepared with Dragon dictation along with smaller phrase technology. Any transcriptional errors that result from this process are unintentional.

## 2019-08-14 ENCOUNTER — Ambulatory Visit (INDEPENDENT_AMBULATORY_CARE_PROVIDER_SITE_OTHER): Payer: 59 | Admitting: Family Medicine

## 2019-08-14 ENCOUNTER — Encounter: Payer: Self-pay | Admitting: Family Medicine

## 2019-08-14 ENCOUNTER — Ambulatory Visit: Payer: Self-pay

## 2019-08-14 ENCOUNTER — Other Ambulatory Visit: Payer: Self-pay

## 2019-08-14 VITALS — BP 128/72 | HR 60 | Ht 74.0 in | Wt 191.0 lb

## 2019-08-14 DIAGNOSIS — M79671 Pain in right foot: Secondary | ICD-10-CM | POA: Diagnosis not present

## 2019-08-14 DIAGNOSIS — M722 Plantar fascial fibromatosis: Secondary | ICD-10-CM

## 2019-08-14 DIAGNOSIS — M720 Palmar fascial fibromatosis [Dupuytren]: Secondary | ICD-10-CM | POA: Diagnosis not present

## 2019-08-14 NOTE — Assessment & Plan Note (Signed)
Patient given injection today.  Tolerated procedure well.  CAM Walker given, discussed which activities of doing which wants to avoid.  Patient will increase activity slowly over the course of next several weeks.  Discussed and slow increase in activity.  Patient is able to bike but will not do any running.

## 2019-08-14 NOTE — Patient Instructions (Addendum)
Good to see you Follow up 3 weeks

## 2019-09-03 ENCOUNTER — Other Ambulatory Visit: Payer: 59

## 2019-09-07 NOTE — Progress Notes (Signed)
[facility] [place], [place] [address] Phone: [phone] Subjective:    I'm seeing this patient by the request  of:    CC: Foot pain follow-up  [id number]   08/14/2019 Patient given injection today.  Tolerated procedure well.  CAM Walker given, discussed which activities of doing which wants to avoid.  Patient will increase activity slowly over the course of next several weeks.  Discussed and slow increase in activity.  Patient is able to bike but will not do any running.  Update 09/08/2019 Timothy [name] is a 58 y.o. male coming in with complaint of right heel pain. Patient states that he has been feeling some improvement.  Patient was given a PRP injection for the plantar fascia on September 11.  Patient has started to feel some improvement.  Would state 50 to 60% better.  Been wearing the boot very regularly.  Has walked without the boot a couple times but nothing severe.     No past medical history on file. Past Surgical History:  Procedure Laterality Date  . TONSILLECTOMY  1966   Social History   Socioeconomic History  . Marital status: Married    Spouse name: Not on file  . Number of children: Not on file  . Years of education: Not on file  . Highest education level: Not on file  Occupational History  . Not on file  Social Needs  . Financial resource strain: Not on file  . Food insecurity    Worry: Not on file    Inability: Not on file  . Transportation needs    Medical: Not on file    Non-medical: Not on file  Tobacco Use  . Smoking status: Never Smoker  . Smokeless tobacco: Never Used  Substance and Sexual Activity  . Alcohol use: Yes    Alcohol/week: 3.0 standard drinks    Types: 3 Glasses of wine per week  . Drug use: No  . Sexual activity: Never    Comment: Married  Lifestyle  . Physical activity    Days per week: Not on file    Minutes per session: Not on file  . Stress: Not on file  Relationships  .  Social Herbalist on phone: Not on file    Gets together: Not on file    Attends religious service: Not on file    Active member of club or organization: Not on file    Attends meetings of clubs or organizations: Not on file    Relationship status: Not on file  Other Topics Concern  . Not on file  Social History Narrative  . Not on file   Allergies  Allergen Reactions  . Erythromycin Nausea And Vomiting   Family History  Problem Relation Age of Onset  . Cancer Mother        Non-Hodgkin Lymphoma  . Heart disease Father        A.Fib   . Cancer Brother        Glioblastoma (gbm)  . Cancer Maternal Grandmother        oral squamous cell  . Heart disease Maternal Grandfather        myocardial infarction  . Cancer Paternal Grandmother        Breast Cancer  . Colon cancer Neg Hx   . Colon polyps Neg Hx   . Prostate cancer Neg Hx   . Rectal cancer Neg Hx  Current Outpatient Medications (Cardiovascular):  .  nitroGLYCERIN (NITRO-DUR) 0.1 mg/hr patch, 1/4 patch daily     Current Outpatient Medications (Other):  Marland Kitchen  Vitamin D, Ergocalciferol, (DRISDOL) 1.25 MG (50000 UT) CAPS capsule, Take 1 capsule (50,000 Units total) by mouth every 7 (seven) days.    Past medical history, social, surgical and family history all reviewed in electronic medical record.  No pertanent information unless stated regarding to the chief complaint.   Review of Systems:  No headache, visual changes, nausea, vomiting, diarrhea, constipation, dizziness, abdominal pain, skin rash, fevers, chills, night sweats, weight loss, swollen lymph nodes, body aches, joint swelling, muscle aches, chest pain, shortness of breath, mood changes.   Objective  Blood pressure 128/82, pulse 63, height 6\' 2"  (1.88 m), weight 191 lb (86.6 kg), SpO2 97 %.   General: No apparent distress alert and oriented x3 mood and affect normal, dressed appropriately.  HEENT: Pupils equal, extraocular movements intact   Respiratory: Patient's speak in full sentences and does not appear short of breath  Cardiovascular: No lower extremity edema, non tender, no erythema  Skin: Warm dry intact with no signs of infection or rash on extremities or on axial skeleton.  Abdomen: Soft nontender  Neuro: Cranial nerves II through XII are intact, neurovascularly intact in all extremities with 2+ DTRs and 2+ pulses.  Lymph: No lymphadenopathy of posterior or anterior cervical chain or axillae bilaterally.  Gait normal with good balance and coordination.  MSK:  Non tender with full range of motion and good stability and symmetric strength and tone of shoulders, elbows, wrist, hip, knee bilaterally.  Right foot exam shows patient does have some minimal tenderness to palpation around the calcaneal region only laterally.  Patient has full range of motion of the ankle otherwise.  No significant tightness of the posterior cord.  Neurovascular intact distally with a negative squeeze test of the transverse arch  Limited musculoskeletal ultrasound was performed and interpreted by Lyndal Pulley  Limited ultrasound of patient's plantar fashion shows the 1 thickness is significantly decreased to 0.35 cm.  In addition to this the lateral aspect bone seems to be improved at the moment.   Impression and Recommendations:      The above documentation has been reviewed and is accurate and complete Lyndal Pulley, DO       Note: This dictation was prepared with Dragon dictation along with smaller phrase technology. Any transcriptional errors that result from this process are unintentional.

## 2019-09-08 ENCOUNTER — Ambulatory Visit (INDEPENDENT_AMBULATORY_CARE_PROVIDER_SITE_OTHER): Payer: 59 | Admitting: Family Medicine

## 2019-09-08 ENCOUNTER — Ambulatory Visit: Payer: Self-pay

## 2019-09-08 ENCOUNTER — Other Ambulatory Visit: Payer: Self-pay

## 2019-09-08 ENCOUNTER — Encounter: Payer: Self-pay | Admitting: Family Medicine

## 2019-09-08 VITALS — BP 128/82 | HR 63 | Ht 74.0 in | Wt 191.0 lb

## 2019-09-08 DIAGNOSIS — M722 Plantar fascial fibromatosis: Secondary | ICD-10-CM | POA: Diagnosis not present

## 2019-09-08 DIAGNOSIS — M79671 Pain in right foot: Secondary | ICD-10-CM | POA: Diagnosis not present

## 2019-09-08 NOTE — Assessment & Plan Note (Signed)
Patient is responded very well to 3 weeks out at this point.  In week 4 we will start patient with a running progression and will be slow.  Continue the vitamin D, nitroglycerin and icing regimen.  Patient should do relatively well overall.  Follow-up with me again in 4 more weeks to hopefully fully released the patient to run without any restrictions.

## 2019-09-08 NOTE — Patient Instructions (Signed)
Great to see you  Shoe next week  Continue the increasing activity  In 2 weeks start running 3 times a week  30 minutes max. 1 minute jog 1 minute walk  Week 2- 2 min jog 1 minute walk  Week 3 - 3 min jog 1 minute walk  See me again in 4-5 weeks then and I will let the reigns go

## 2019-10-06 ENCOUNTER — Ambulatory Visit: Payer: Self-pay

## 2019-10-06 ENCOUNTER — Encounter: Payer: Self-pay | Admitting: Family Medicine

## 2019-10-06 ENCOUNTER — Other Ambulatory Visit: Payer: Self-pay

## 2019-10-06 ENCOUNTER — Ambulatory Visit (INDEPENDENT_AMBULATORY_CARE_PROVIDER_SITE_OTHER): Payer: 59 | Admitting: Family Medicine

## 2019-10-06 VITALS — BP 130/82 | HR 82 | Ht 74.0 in | Wt 189.0 lb

## 2019-10-06 DIAGNOSIS — M722 Plantar fascial fibromatosis: Secondary | ICD-10-CM | POA: Diagnosis not present

## 2019-10-06 DIAGNOSIS — M79671 Pain in right foot: Secondary | ICD-10-CM

## 2019-10-06 NOTE — Assessment & Plan Note (Signed)
Patient doing significantly better at this time.  On ultrasound patient's plantar fascial is within a normal limit now.  At this point patient should be relatively well in can discontinue the nitroglycerin and start to run as tolerated.

## 2019-10-06 NOTE — Progress Notes (Signed)
[facility] [address] [place] [place], [place] [address] Phone: [phone] Subjective:   Timothy [name], am serving as a scribe for Dr. Hulan Saas.  I'm seeing this patient by the request  of:    CC: Foot pain follow-up  [id number]   09/08/2019 Patient is responded very well to 3 weeks out at this point.  In week 4 we will start patient with a running progression and will be slow.  Continue the vitamin D, nitroglycerin and icing regimen.  Patient should do relatively well overall.  Follow-up with me again in 4 more weeks to hopefully fully released the patient to run without any restrictions.   Update 10/06/2019 Timothy [name] is a 58 y.o. male coming in with complaint of right heel pain. Has not tried running since last visit. Is feeling better.  Patient would state that he is feeling 80 to 90% better.  States that there is [name] pain with regular daily activities.  Not running yet though.  Patient feels like he has made significant progress at this time.    [name] past medical history on file. Past Surgical History:  Procedure Laterality Date  . TONSILLECTOMY  1966   Social History   Socioeconomic History  . Marital status: Married    Spouse name: Not on file  . Number of children: Not on file  . Years of education: Not on file  . Highest education level: Not on file  Occupational History  . Not on file  Social Needs  . Financial resource strain: Not on file  . Food insecurity    Worry: Not on file    Inability: Not on file  . Transportation needs    Medical: Not on file    Non-medical: Not on file  Tobacco Use  . Smoking status: Never Smoker  . Smokeless tobacco: Never Used  Substance and Sexual Activity  . Alcohol use: Yes    Alcohol/week: 3.0 standard drinks    Types: 3 Glasses of wine per week  . Drug use: [name]  . Sexual activity: Never    Comment: Married  Lifestyle  . Physical activity    Days per week: Not on file    Minutes per  session: Not on file  . Stress: Not on file  Relationships  . Social Herbalist on phone: Not on file    Gets together: Not on file    Attends religious service: Not on file    Active member of club or organization: Not on file    Attends meetings of clubs or organizations: Not on file    Relationship status: Not on file  Other Topics Concern  . Not on file  Social History Narrative  . Not on file   Allergies  Allergen Reactions  . Erythromycin Nausea And Vomiting   Family History  Problem Relation Age of Onset  . Cancer Mother        Non-Hodgkin Lymphoma  . Heart disease Father        A.Fib   . Cancer Brother        Glioblastoma (gbm)  . Cancer Maternal Grandmother        oral squamous cell  . Heart disease Maternal Grandfather        myocardial infarction  . Cancer Paternal Grandmother        Breast Cancer  . Colon cancer Neg Hx   . Colon polyps Neg Hx   .  Prostate cancer Neg Hx   . Rectal cancer Neg Hx      Current Outpatient Medications (Cardiovascular):  .  nitroGLYCERIN (NITRO-DUR) 0.1 mg/hr patch, 1/4 patch daily     Current Outpatient Medications (Other):  Marland Kitchen  Vitamin D, Ergocalciferol, (DRISDOL) 1.25 MG (50000 UT) CAPS capsule, Take 1 capsule (50,000 Units total) by mouth every 7 (seven) days.    Past medical history, social, surgical and family history all reviewed in electronic medical record.  [name] pertanent information unless stated regarding to the chief complaint.   Review of Systems:  [name] headache, visual changes, nausea, vomiting, diarrhea, constipation, dizziness, abdominal pain, skin rash, fevers, chills, night sweats, weight loss, swollen lymph nodes, body aches, joint swelling, muscle aches, chest pain, shortness of breath, mood changes.   Objective  Blood pressure 130/82, pulse 82, height 6\' 2"  (1.88 m), weight 189 lb (85.7 kg), SpO2 99 %.    General: [name] apparent distress alert and oriented x3 mood and affect normal, dressed  appropriately.  HEENT: Pupils equal, extraocular movements intact  Respiratory: Patient's speak in full sentences and does not appear short of breath  Cardiovascular: [name] lower extremity edema, non tender, [name] erythema  Skin: Warm dry intact with [name] signs of infection or rash on extremities or on axial skeleton.  Abdomen: Soft nontender  Neuro: Cranial nerves II through XII are intact, neurovascularly intact in all extremities with 2+ DTRs and 2+ pulses.  Lymph: [name] lymphadenopathy of posterior or anterior cervical chain or axillae bilaterally.  Gait normal with good balance and coordination.  MSK:  Non tender with full range of motion and good stability and symmetric strength and tone of shoulders, elbows, wrist, hip, knee and ankles bilaterally.  Foot exam on the right side the patient does have very mild breakdown the longitudinal arch.  Mild breakdown the transverse arch.  Very minimal pain on the calcaneal region laterally.  Limited musculoskeletal ultrasound was performed and interpreted by Lyndal Pulley Limited ultrasound of patient's calcaneal region shows the patient's plantar fascia is at 1 to 9 cm.  Significant decrease in size from previous.   Impression and Recommendations:     The above documentation has been reviewed and is accurate and complete Lyndal Pulley, DO       Note: This dictation was prepared with Dragon dictation along with smaller phrase technology. Any transcriptional errors that result from this process are unintentional.

## 2019-11-03 DIAGNOSIS — H5310 Unspecified subjective visual disturbances: Secondary | ICD-10-CM | POA: Diagnosis not present

## 2019-11-11 ENCOUNTER — Telehealth: Payer: Self-pay | Admitting: Internal Medicine

## 2019-11-11 NOTE — Telephone Encounter (Signed)
58yo M was tested by rapid antigen test on 12/6 which was negative, roughly day #6-7, close contact to known +, his son. He remains asymptomatic  [facility] [phone]

## 2020-08-11 IMAGING — MR MR ANKLE*R* W/O CM
4 of 5 series · 13 of 40 positions shown · non-contrast
Comparison: None.

CLINICAL DATA: Chronic right foot and ankle pain. The patient is a
runner. No known injury.

EXAM:
MRI OF THE RIGHT ANKLE WITHOUT CONTRAST
TECHNIQUE: Multiplanar, multisequence MR imaging of the ankle was performed. No
intravenous contrast was administered.

[Series 3: PD fat-sat · axial · right · 3.0mm · 0.25mm/px · z∈[-87,+9]mm · 4 of 30 slices shown]
[im 1/30]
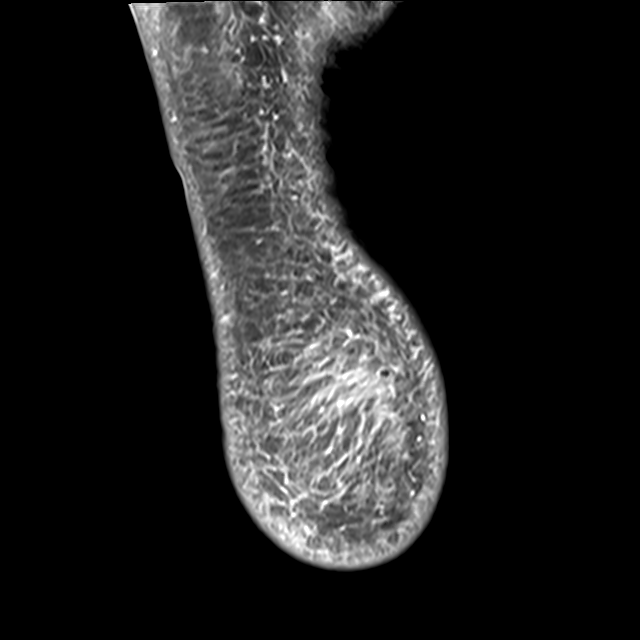
[im 5/30]
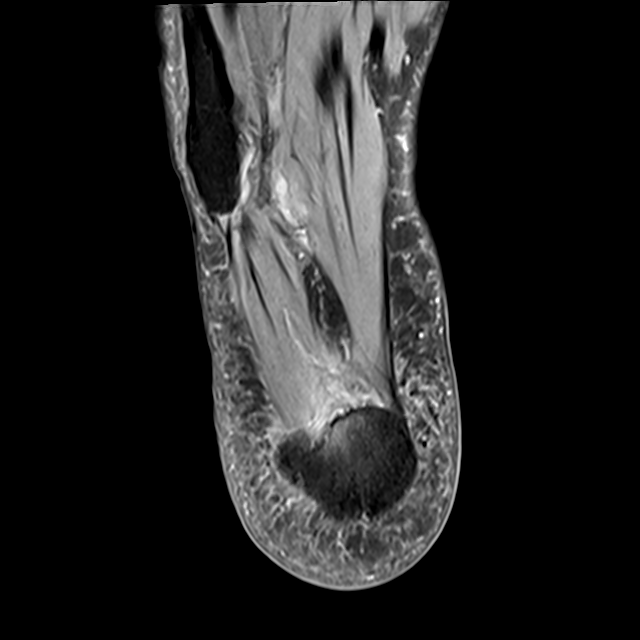
[im 17/30]
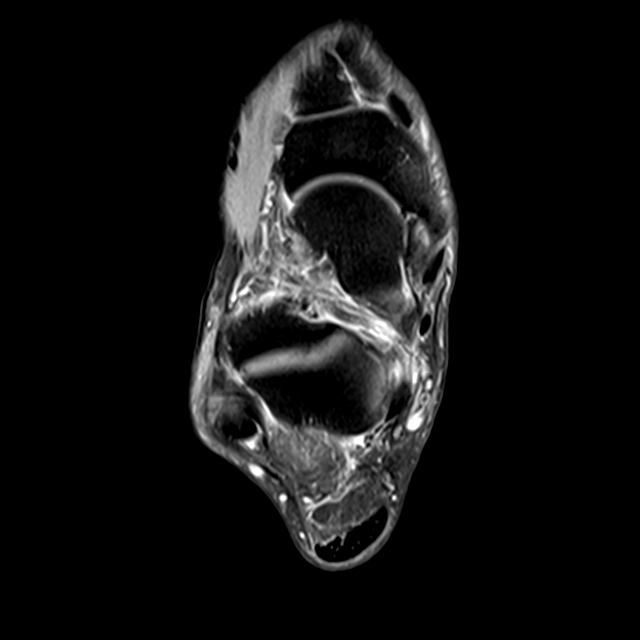
[im 25/30]
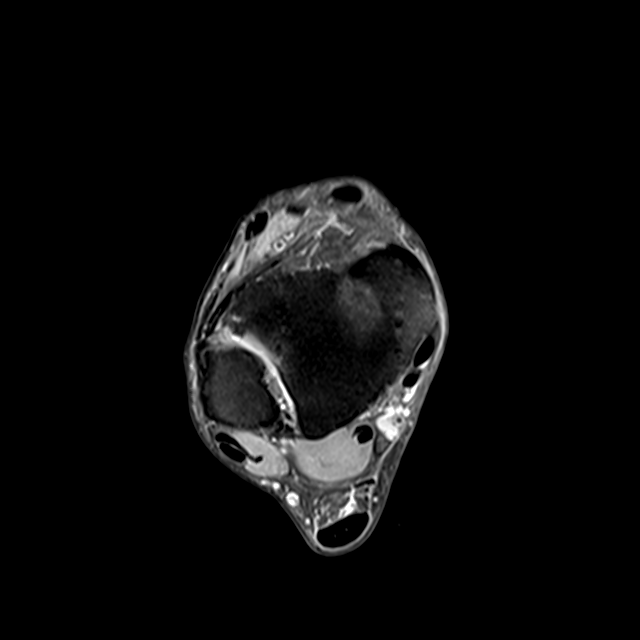

[Series 4: T2 fat-sat · axial · right · 3.0mm · 0.25mm/px · z∈[-71,+9]mm · 3 of 30 slices shown (1 of 2)]
[im 5/30]
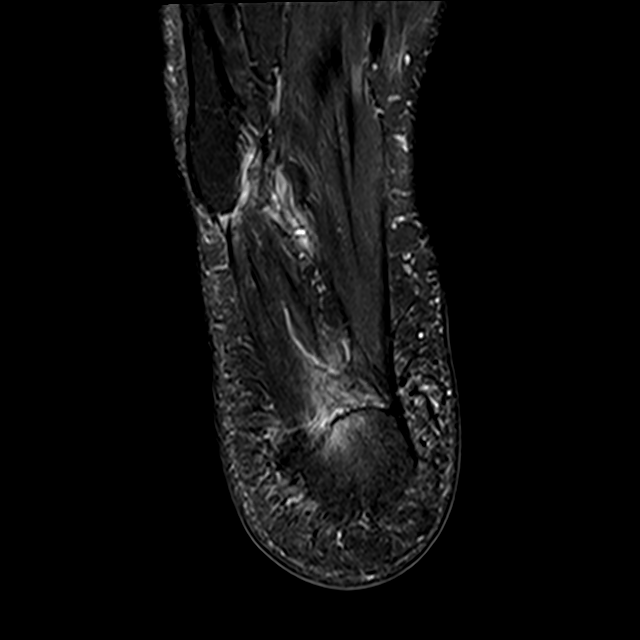
[im 17/30]
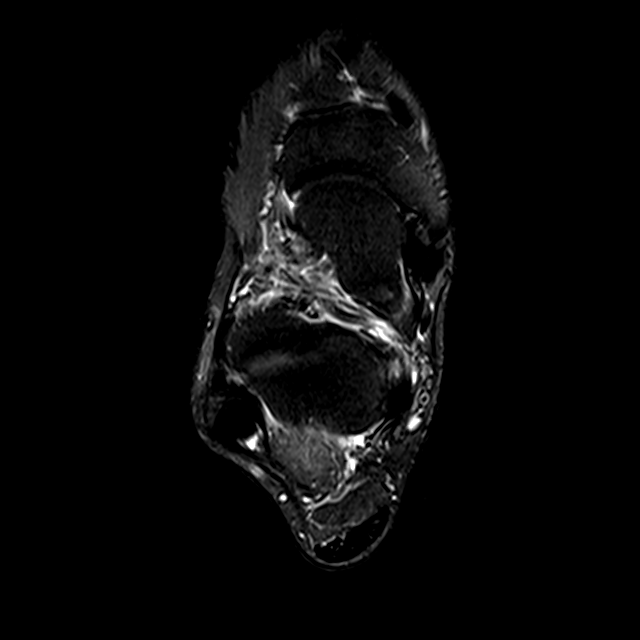
[im 25/30]
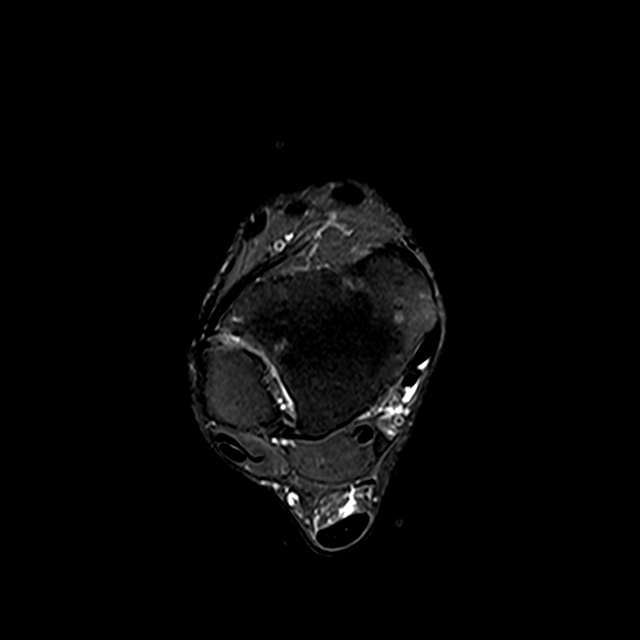

[Series 5: T1 · sagittal · right · 4.0mm · 0.27mm/px · 3 of 24 slices shown]
[im 4/24]
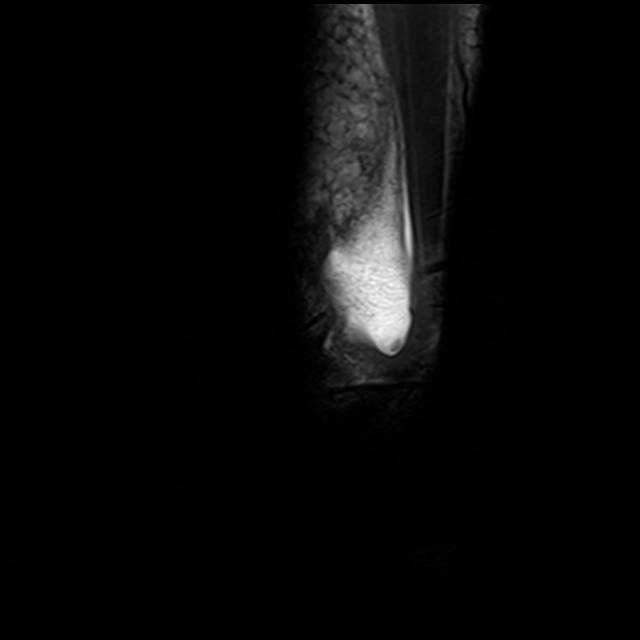
[im 12/24]
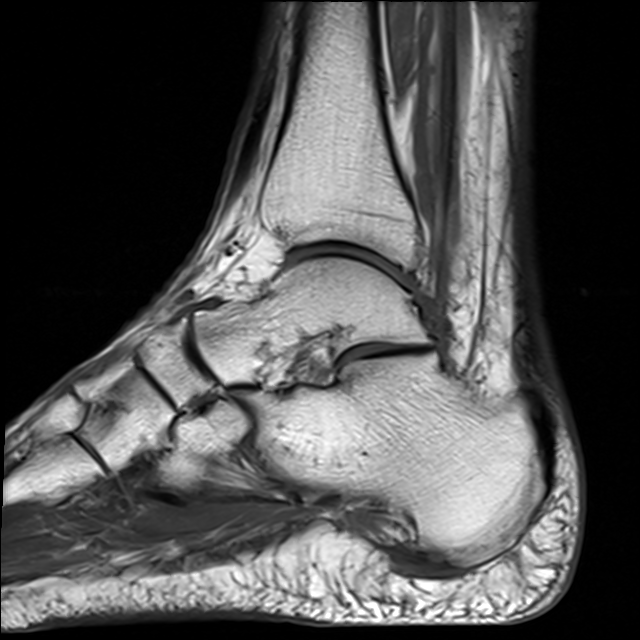
[im 20/24]
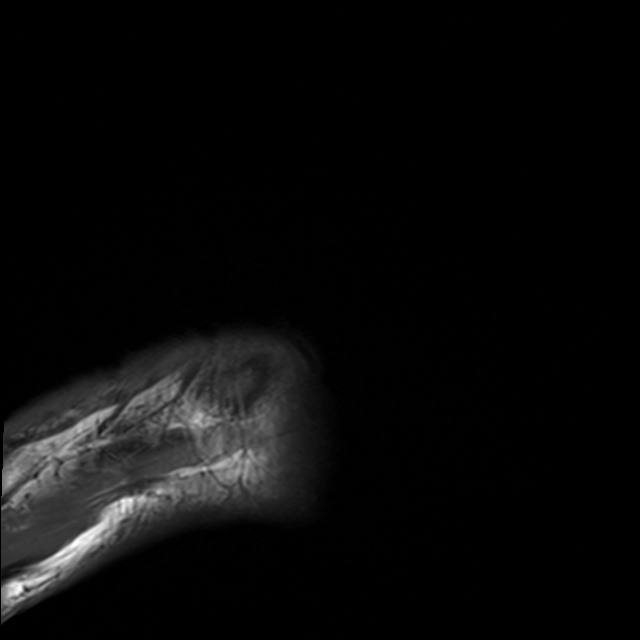

[Series 7: T2 fat-sat · coronal · right · 3.0mm · 0.25mm/px · 3 of 37 slices shown (2 of 2)]
[im 5/37]
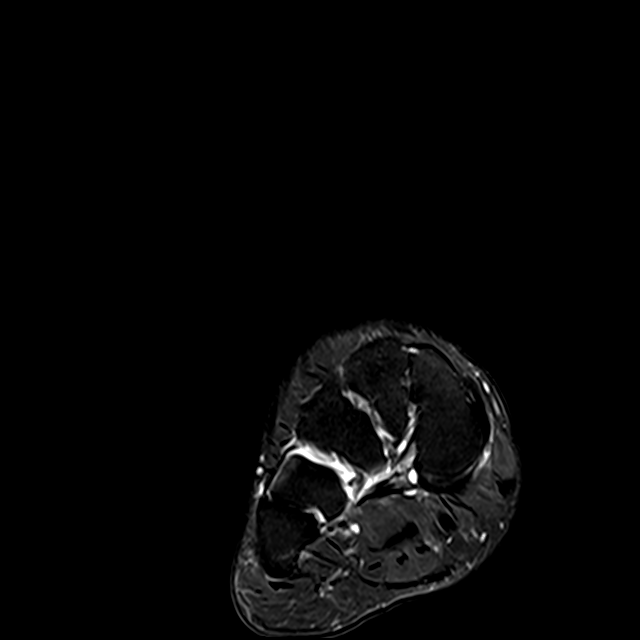
[im 21/37]
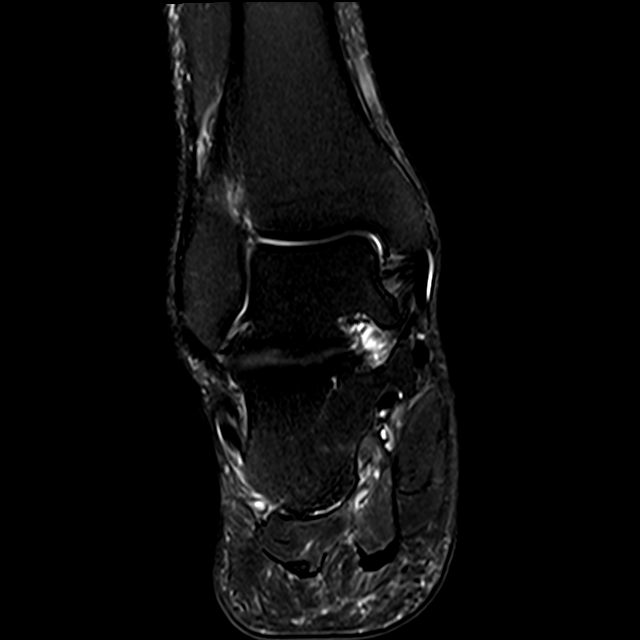
[im 33/37]
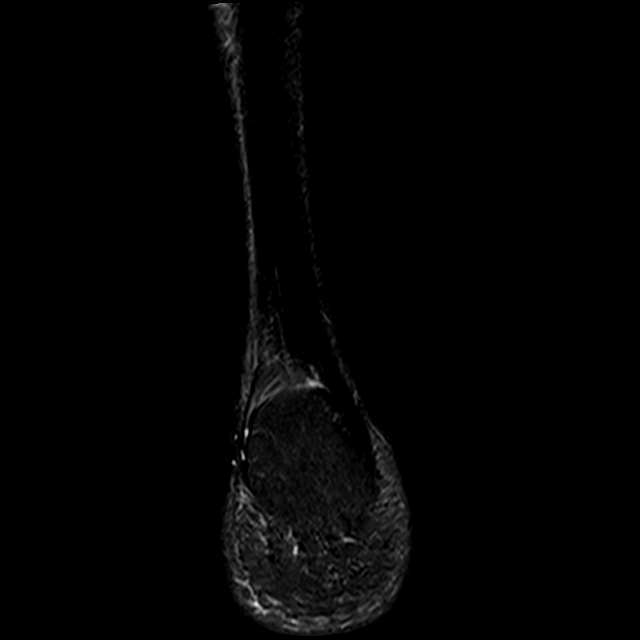

[13 of 40 positions shown; findings below may reference images not displayed]

FINDINGS: TENDONS

Peroneal: Intact.

Posteromedial: Intact.

Anterior: Intact.

Achilles: Intact.

Plantar Fascia: Intrasubstance increased T2 signal is much worse in
the medial cord. A very small focal tear in the medial cord is
identified. There is some reactive marrow edema in the calcaneus at
the plantar fascia attachment.

LIGAMENTS

Lateral: Intact.

Medial: Intact.

CARTILAGE

Ankle Joint: Tiny focus of subchondral edema in the lateral talar
dome with thinning of overlying hyaline cartilage is identified.

Subtalar Joints/Sinus Tarsi: Negative.

Bones: The patient has an os trigonum measuring 1 cm AP by 1.5 cm
transverse by 0.8 cm AP. There is some fluid between the os and
adjacent talus and mild marrow edema in the os at the synchondrosis.

Other: None.
IMPRESSION: Moderately severe appearing plantar fasciitis of the medial cord
with a punctate tear and reactive marrow edema in the calcaneus.

Os trigonum with fluid in the synchondrosis and minimal marrow edema
in the os suggestive of os trigonum syndrome.

Punctate osteochondral lesion lateral talar dome without fragment
instability.

## 2021-02-23 ENCOUNTER — Ambulatory Visit (INDEPENDENT_AMBULATORY_CARE_PROVIDER_SITE_OTHER): Payer: 59 | Admitting: Internal Medicine

## 2021-02-23 ENCOUNTER — Other Ambulatory Visit: Payer: Self-pay

## 2021-02-23 ENCOUNTER — Other Ambulatory Visit (HOSPITAL_COMMUNITY): Payer: Self-pay | Admitting: Internal Medicine

## 2021-02-23 DIAGNOSIS — Z23 Encounter for immunization: Secondary | ICD-10-CM | POA: Diagnosis not present

## 2021-02-23 DIAGNOSIS — Z7184 Encounter for health counseling related to travel: Secondary | ICD-10-CM

## 2021-02-23 DIAGNOSIS — Z0000 Encounter for general adult medical examination without abnormal findings: Secondary | ICD-10-CM

## 2021-02-23 DIAGNOSIS — Z9189 Other specified personal risk factors, not elsewhere classified: Secondary | ICD-10-CM

## 2021-02-23 MED ORDER — AZITHROMYCIN 500 MG PO TABS: 500.0000 mg | ORAL_TABLET | Freq: Every day | ORAL | 0 refills | Status: AC

## 2021-02-23 MED ORDER — ATOVAQUONE-PROGUANIL HCL 250-100 MG PO TABS: 1.0000 | ORAL_TABLET | Freq: Every day | ORAL | 0 refills | Status: AC

## 2021-02-23 MED FILL — AZITHROMYCIN 500 MG TABLET: 500 | 5 days supply | Qty: 5 | Fill #0

## 2021-02-23 MED FILL — ATOVAQUONE-PROGUANIL 250-10: 250-100 | 21 days supply | Qty: 21 | Fill #0

## 2021-02-23 NOTE — Patient Instructions (Addendum)
[facility]                [address] [place]                   [place], [place] [address]                      Phone: [phone]                        Fax: [phone]   Planned departure date: may 28        Planned return date: June 10th Countries of travel: [place], [place]  Guidelines for the Prevention & Treatment of Traveler's Diarrhea  Prevention: "Boil it, Peel it, Lacinda Axon it, or Forget it"   the fewer chances -> lower risk: try to stick to food & water precautions as much as possible"   If it's "piping hot"; it is probably okay, if not, it may not be   Treatment   1) You should always take care to drink lots of fluids in order to avoid dehydration   2) You should bring medications with you in case you come down with a case of diarrhea   3) OTC = bring pepto-bismol - can take with initial abdominal symptoms;                    Imodium - can help slow down your intestinal tract, can help relief cramps                    and diarrhea, can take if no bloody diarrhea  Use azithromycin if needed for traveler's diarrhea  Guidelines for the Prevention of Malaria  Avoidance:  -fewer mosquito bites = lower risk. Mosquitos can bite at night as well as daytime  -cover up (long sleeve clothing), mosquito nets, screens  -Insect repellent for your skin ( DEET containing lotion > 20%): for clothes ( permethrin spray) www.insectshield.com 775 793 6934) for pretreated permethrin  On may 26, start malarone for malaria prevention.   Immunizations received today: typhoid vaccine and hep A #1 Future immunizations, if indicated  -- hep A #2 ,in Sept 2022  Prior to travel:  1) Be sure to pick up appropriate prescriptions, including medicine you take daily. Do not expect to be able to fill your prescriptions abroad.  2) check country guidance for covid testing requirements 3) Strongly consider obtaining traveler's insurance,  including emergency evacuation insurance. Most plans in the [place] do not cover participants abroad. (see below for resources)  4) Register at the appropriate [place] embassy or consulate with travel dates so they are aware of your presence in-country and for helpful advice during travel using the [employer] (STEP, GreenNylon.com.cy).  5) Leave contact information with a relative or friend.  6) Keep a Research officer, political party, credit cards in case they become lost or stolen  7) Inform your credit card company that you will be travelling abroad   During travel:  1) If you become ill and need medical advice, the [place] [employer]	[place] of the country you are traveling in general provides a list of [place] speaking doctors.  We are also available on MyChart for remote consultation if you register prior to travel. 2) Avoid motorcycles or scooters when at all possible. Traffic laws in many countries are lax and accidents occur frequently.  3) Do  not take any unnecessary risks that you wouldn't do at home.   Resources:  -Country specific information: BlindResource.ca or GreenNylon.com.cy  -Press photographer (DEET, mosquito nets): [employer], [employer] store, [employer], [place] insurance options: gatewayplans.com; http://clayton-rivera.info/; travelguard.com or [employer], gninsurance.com or info@gninsurance .com, (680) 369-0561.   Post Travel:  If you return from your trip ill, call your primary care doctor or our travel clinic @ [phone].   Enjoy your trip and know that with proper pre-travel preparation, most people have an enjoyable and uninterrupted trip!

## 2021-02-23 NOTE — Progress Notes (Signed)
Subjective:   Timothy [name] is a 60 y.o. male who presents to the Infectious Disease clinic for travel consultation. Planned departure date: may 28         Planned return date: June 9 Countries of travel: [place], [place] Areas in country: rural and urban Accommodations: hotel and camp Purpose of travel: vacation Prior travel out of [place]: yes  Hx of motion sickness (boat and plane years back)   Objective:   Medications: reviewed    Assessment:   No contraindications to travel. none   Plan:    traveler's diarrhea =azithromycin 500mg  x 5 tabs if needed for TD  Malaria proph = malarone plus gave precautions regarding deet/premethrin  Travel vaccine = uptodate with exception to typhoid injection and hep A #1

## 2021-02-23 NOTE — Addendum Note (Signed)
Addended by: Landis Gandy on: 02/23/2021 05:09 PM   Modules accepted: Orders
